# Patient Record
Sex: Male | Born: 1965 | Race: White | Hispanic: No | Marital: Married | Smoking: Current every day smoker
Health system: Southern US, Community
[De-identification: ages and names within clinical notes are randomized; demographics above are authoritative.]

---

## 2001-08-10 ENCOUNTER — Ambulatory Visit (HOSPITAL_COMMUNITY): Admission: RE | Admit: 2001-08-10 | Discharge: 2001-08-10 | Payer: Self-pay | Admitting: General Surgery

## 2003-07-28 ENCOUNTER — Emergency Department (HOSPITAL_COMMUNITY): Admission: EM | Admit: 2003-07-28 | Discharge: 2003-07-28 | Payer: Self-pay | Admitting: Emergency Medicine

## 2004-07-13 ENCOUNTER — Observation Stay (HOSPITAL_COMMUNITY): Admission: EM | Admit: 2004-07-13 | Discharge: 2004-07-15 | Payer: Self-pay | Admitting: *Deleted

## 2005-02-10 IMAGING — CR DG CHEST 2V
4 series · 4 of 4 positions shown · non-contrast
Comparison: none

CLINICAL DATA: Pneumothorax

[view not recorded (1 of 4)]
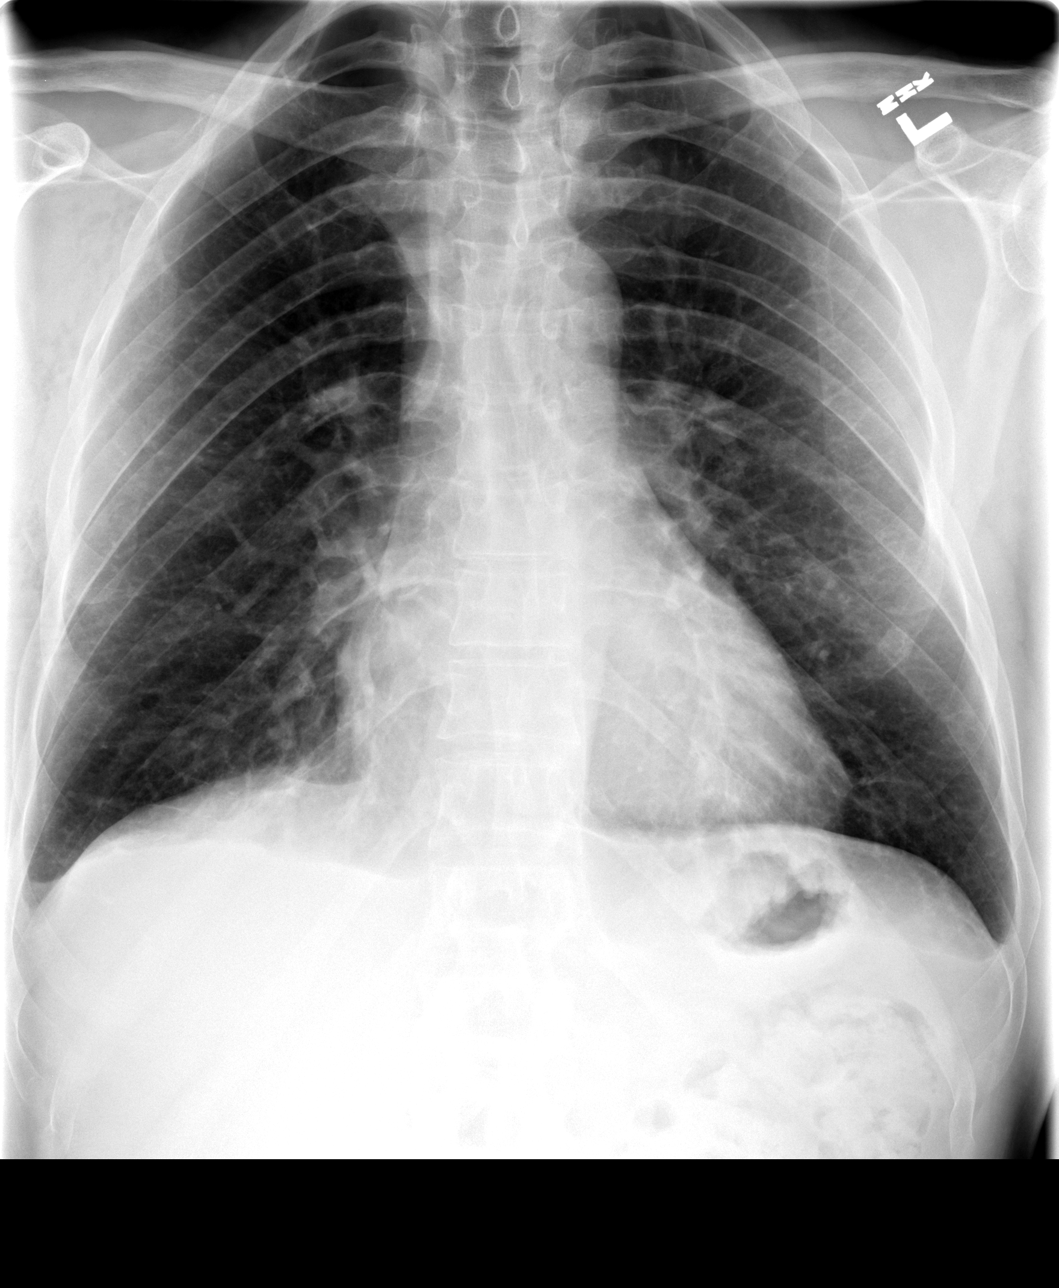

[view not recorded (2 of 4)]
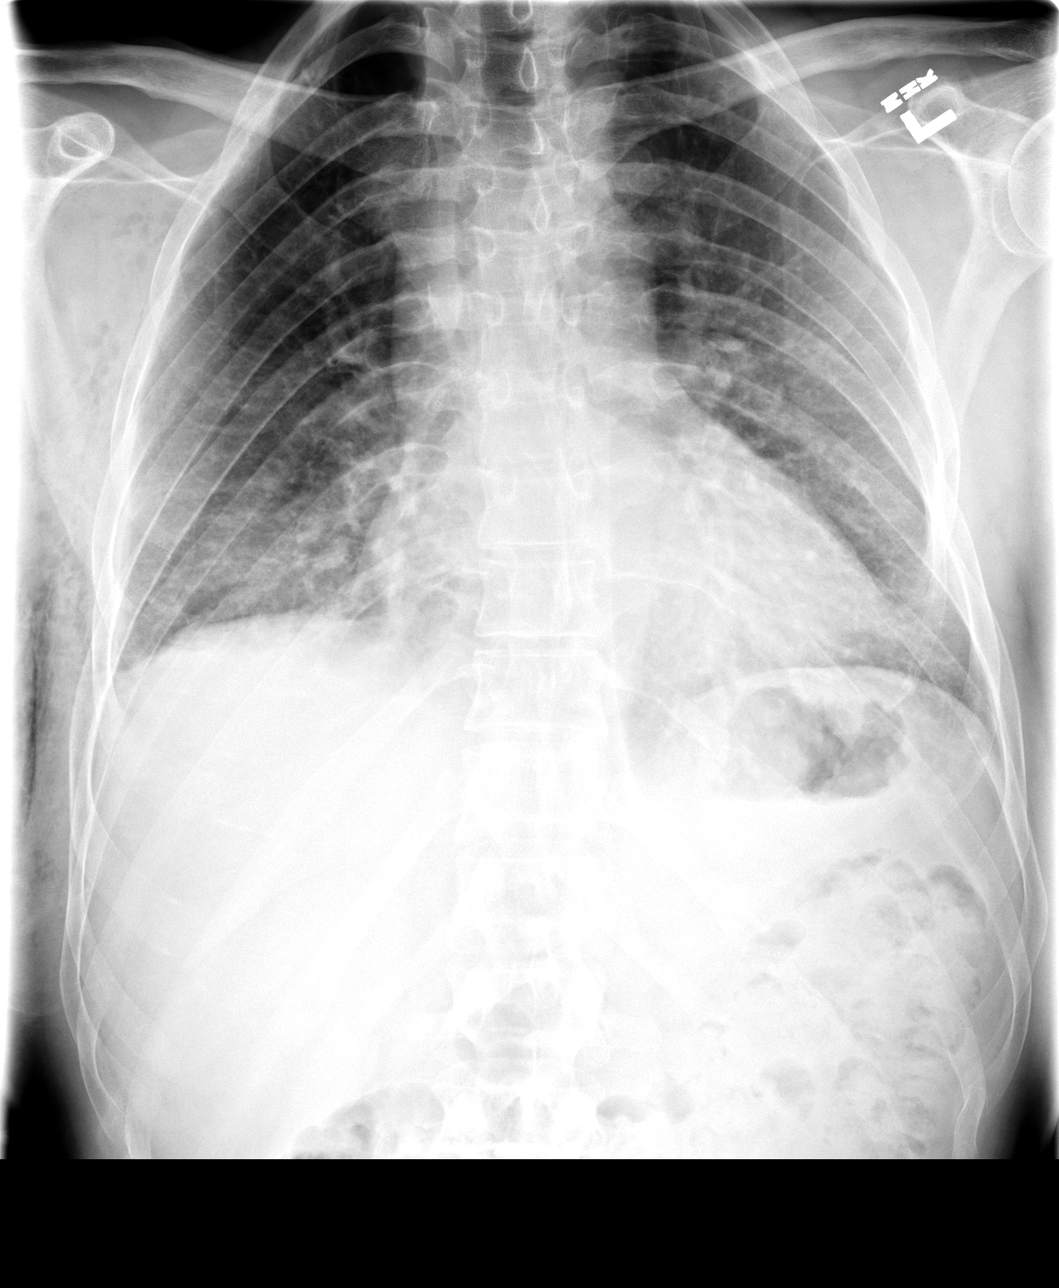

[view not recorded (3 of 4)]
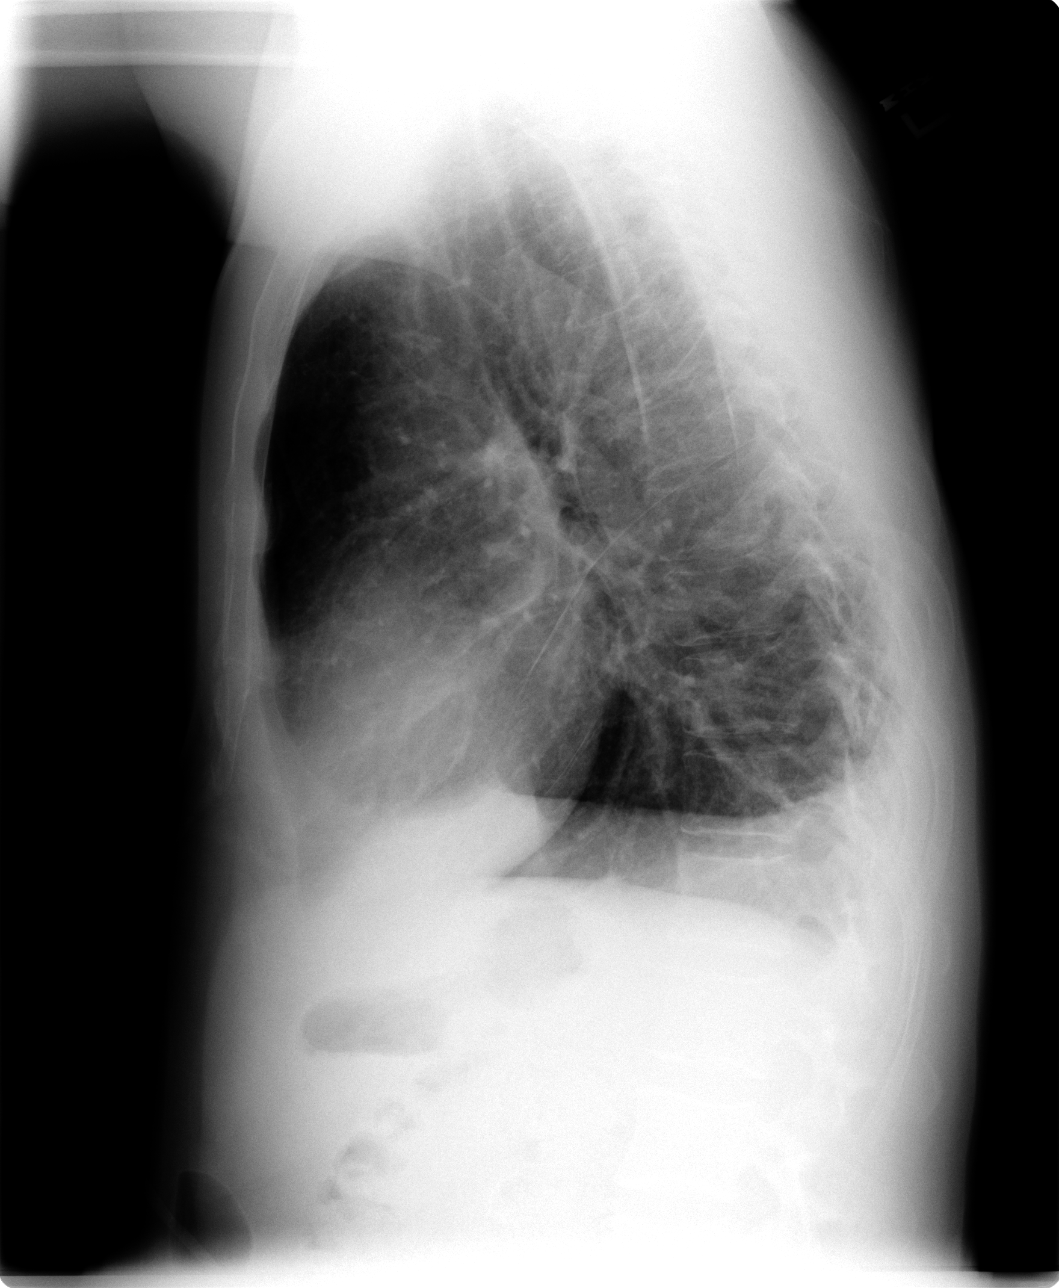

[view not recorded (4 of 4)]
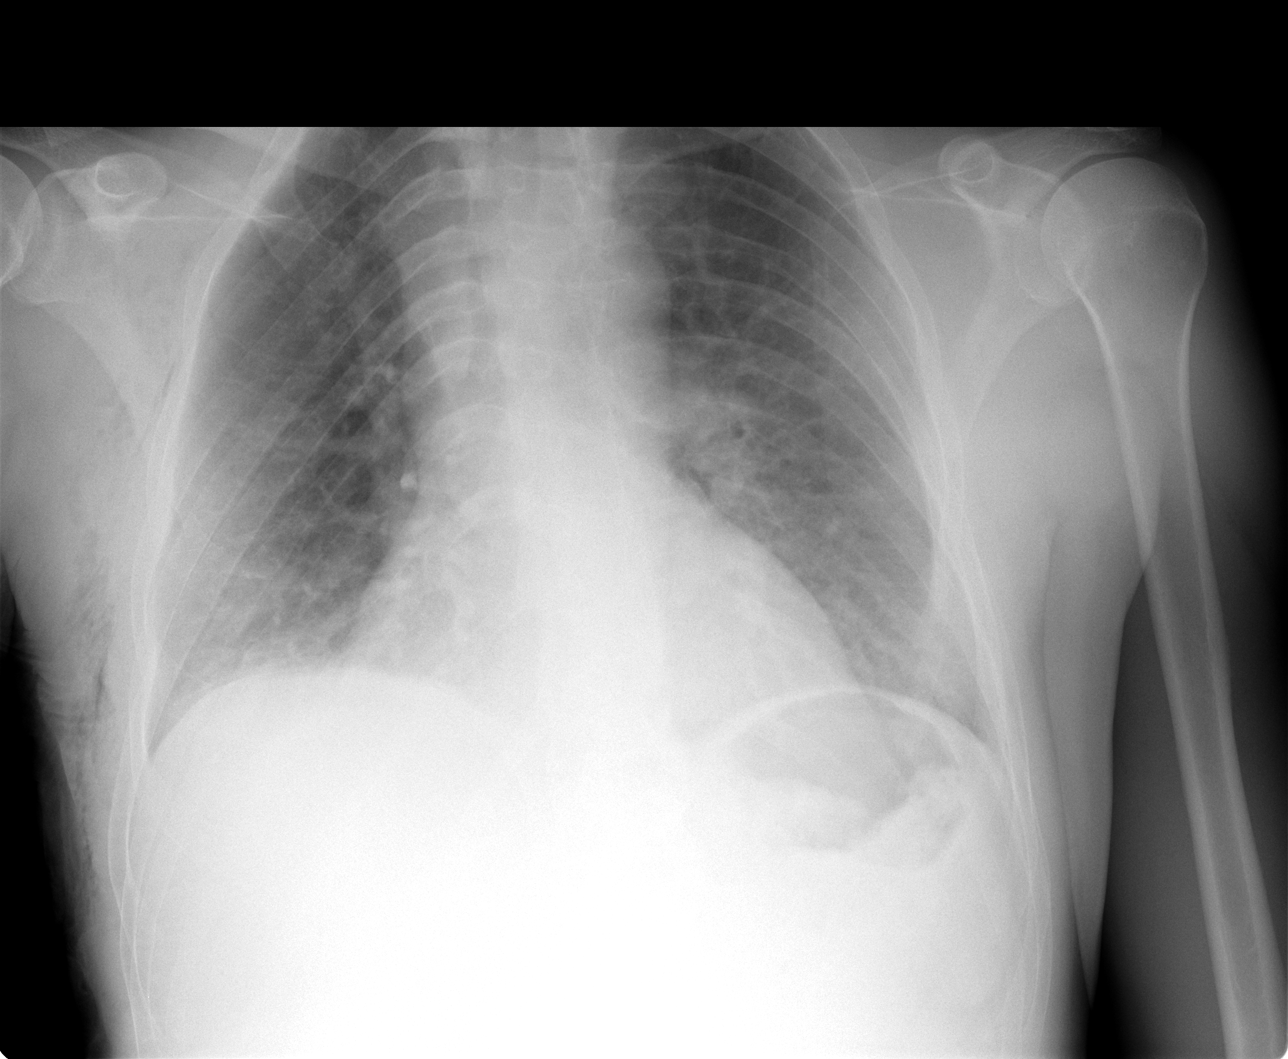

[4 of 4 positions shown; findings below may reference images not displayed]

Chest 2 view:

Comparison 07/14/2004. No definite pneumothorax is seen, with a paucity of
markings of the right lung apex. A small amount of subcutaneous emphysema
persists on the right. Small right pleural effusion. Coarse bronchovascular
markings without confluent infiltrate or overt edema. Heart size upper limits
normal. Visualized bones unremarkable.
IMPRESSION: 1. No evidence of pneumothorax.
2. Small right pleural effusion

## 2018-04-27 ENCOUNTER — Telehealth: Payer: Self-pay | Admitting: Family Medicine

## 2018-04-27 NOTE — Telephone Encounter (Signed)
Mr.Spilker's wife is a current pt, husband was a pt of Dr.Steve's but due to Mr.Haugan not being seen since 03-24-2012 he is no longer listed as a current patient. Mr.Watkin would like to schedule an appt, Mr.Hyndman's wife was made aware if 5 years or more they are considered a new pt and we are no longer accepting new patients. Advise.

## 2018-04-28 NOTE — Telephone Encounter (Signed)
Since already seen in past ok

## 2018-04-28 NOTE — Telephone Encounter (Signed)
Appt scheduled

## 2018-04-30 ENCOUNTER — Ambulatory Visit (INDEPENDENT_AMBULATORY_CARE_PROVIDER_SITE_OTHER): Payer: 59 | Admitting: Family Medicine

## 2018-04-30 ENCOUNTER — Encounter: Payer: Self-pay | Admitting: Family Medicine

## 2018-04-30 VITALS — BP 160/100 | Temp 97.4°F | Ht 69.0 in | Wt 167.0 lb

## 2018-04-30 DIAGNOSIS — H6501 Acute serous otitis media, right ear: Secondary | ICD-10-CM

## 2018-04-30 MED ORDER — AMOXICILLIN-POT CLAVULANATE 875-125 MG PO TABS: ORAL_TABLET | ORAL | 0 refills | Status: AC

## 2018-04-30 NOTE — Progress Notes (Signed)
   Subjective:    Patient ID: Connor Ward, male    DOB: 1965/11/02, 52 y.o.   MRN: [id number]  HPI Patient is here today with complaints of right ear is stopped up and he says it started three weeks ago they also "pop like when you go to the [place]". He has been using peroxide in it to see if that would help.  Patient patient has had some mild congestion but not substantial.  Patient does smoke.  Couple weeks sensation of right ear feeling up at times.  Popping at times.  No major pain.  No fever  Review of Systems No cough no vomiting no diarrhea    Objective:   Physical Exam Alert active good hydration positive right otitis media present left TM normal pharynx normal neck supple.  Lungs clear.  Heart regular rate and rhythm.       Assessment & Plan:  Impression otitis media antibiotics prescribed.  Encouraged to stop smoking.  Regular wellness exams strongly encouraged

## 2019-08-04 ENCOUNTER — Encounter: Payer: Self-pay | Admitting: Family Medicine

## 2020-01-01 ENCOUNTER — Ambulatory Visit
Admission: EM | Admit: 2020-01-01 | Discharge: 2020-01-01 | Disposition: A | Discharge status: Home / Self Care | Payer: 59 | Attending: Emergency Medicine | Admitting: Emergency Medicine

## 2020-01-01 ENCOUNTER — Other Ambulatory Visit: Payer: Self-pay

## 2020-01-01 DIAGNOSIS — M778 Other enthesopathies, not elsewhere classified: Secondary | ICD-10-CM | POA: Diagnosis not present

## 2020-01-01 MED ORDER — SULFAMETHOXAZOLE-TRIMETHOPRIM 800-160 MG PO TABS: 1.0000 | ORAL_TABLET | Freq: Two times a day (BID) | ORAL | 0 refills | Status: AC

## 2020-01-01 MED ORDER — PREDNISONE 10 MG (21) PO TBPK: ORAL_TABLET | ORAL | 0 refills | Status: AC

## 2020-01-01 NOTE — ED Triage Notes (Signed)
Hit right elbow yesterday, is swollen this morning, denies pain

## 2020-01-01 NOTE — ED Provider Notes (Signed)
[facility]   [id number] 01/01/20 Arrival Time: 0807   CC: Elbow pain   SUBJECTIVE: History from: patient.  Connor Ward is a 54 y.o. male who presents to the urgent care for complaint of right temporal swelling and warmth that started this morning.  Reportedly he hit the right ankle yesterday.  He localizes the pain to the right elbow.  He describes the pain as constant and achy.  He has tried OTC medications without relief.  His symptoms are made worse with ROM.  He denies similar symptoms in the past.  Denies chills, fever, nausea, vomiting, diarrhea.   ROS: As per HPI.  All other pertinent ROS negative.     History reviewed. No pertinent past medical history. History reviewed. No pertinent surgical history. No Known Allergies No current facility-administered medications on file prior to encounter.   Current Outpatient Medications on File Prior to Encounter  Medication Sig Dispense Refill   amoxicillin-clavulanate (AUGMENTIN) 875-125 MG tablet One p o bid for ten d 20 tablet 0   Social History   Socioeconomic History   Marital status: Married    Spouse name: Not on file   Number of children: Not on file   Years of education: Not on file   Highest education level: Not on file  Occupational History   Not on file  Tobacco Use   Smoking status: Current Every Day Smoker    Packs/day: 1.00    Years: 30.00    Pack years: 30.00    Types: Cigarettes   Smokeless tobacco: Never Used  Substance and Sexual Activity   Alcohol use: Never   Drug use: Never   Sexual activity: Not on file  Other Topics Concern   Not on file  Social History Narrative   Not on file   Social Determinants of Health   Financial Resource Strain:    Difficulty of Paying Living Expenses:   Food Insecurity:    Worried About Programme researcher, broadcasting/film/video in the Last Year:    Barista in the Last Year:   Transportation Needs:    Freight forwarder (Medical):    Lack  of Transportation (Non-Medical):   Physical Activity:    Days of Exercise per Week:    Minutes of Exercise per Session:   Stress:    Feeling of Stress :   Social Connections:    Frequency of Communication with Friends and Family:    Frequency of Social Gatherings with Friends and Family:    Attends Religious Services:    Active Member of Clubs or Organizations:    Attends Engineer, structural:    Marital Status:   Intimate Partner Violence:    Fear of Current or Ex-Partner:    Emotionally Abused:    Physically Abused:    Sexually Abused:    Family History  Family history unknown: Yes    OBJECTIVE:  Vitals:   01/01/20 0815  BP: (!) 159/96  Pulse: 64  Resp: 16  Temp: 97.9 F (36.6 C)  TempSrc: Oral  SpO2: 97%     Physical Exam Vitals and nursing note reviewed.  Constitutional:      General: He is not in acute distress.    Appearance: Normal appearance. He is normal weight. He is not ill-appearing, toxic-appearing or diaphoretic.  Cardiovascular:     Rate and Rhythm: Normal rate and regular rhythm.     Pulses: Normal pulses.     Heart sounds: Normal heart sounds.  No murmur heard.  No friction rub. No gallop.   Pulmonary:     Effort: Pulmonary effort is normal. No respiratory distress.     Breath sounds: Normal breath sounds. No stridor. No wheezing, rhonchi or rales.  Chest:     Chest wall: No tenderness.  Musculoskeletal:        General: Swelling and tenderness present.     Right elbow: Swelling and deformity present. No lacerations. Tenderness present.     Left elbow: Normal.     Comments: The right elbow is with obvious deformity when compared to the left elbow.  Warmth present.  There is no surface trauma, ecchymosis, open wound.  Normal range of motion with mild pain.  Neurovascular status intact.  Neurological:     Mental Status: He is alert.      LABS:  No results found for this or any previous visit (from the past 24 hour(s)).    ASSESSMENT & PLAN:  1. Right elbow tendinitis     Meds ordered this encounter  Medications   predniSONE (STERAPRED UNI-PAK 21 TAB) 10 MG (21) TBPK tablet    Sig: Take 6 tabs by mouth daily  for 1 days, then 5 tabs for 1 days, then 4 tabs for 1 days, then 3 tabs for 1 days, 2 tabs for 1 days, then 1 tab by mouth daily for 1 days    Dispense:  21 tablet    Refill:  0   sulfamethoxazole-trimethoprim (BACTRIM DS) 800-160 MG tablet    Sig: Take 1 tablet by mouth 2 (two) times daily for 7 days.    Dispense:  14 tablet    Refill:  0    Patient is stable at discharge.  Prednisone was prescribed for both tendinitis.  Bactrim DS was prescribed as patient has developed abscess in the past with tendinitis.  Discharge Instructions  Prednisone was prescribed/take as directed Bactrim DS was prescribed/take as directed Follow-up with PCP Follow RICE instruction that is attached Return or go to ED for worsening of symptoms  Reviewed expectations re: course of current medical issues. Questions answered. Outlined signs and symptoms indicating need for more acute intervention. Patient verbalized understanding. After Visit Summary given.      Note: This document was prepared using Dragon voice recognition software and may include unintentional dictation errors.    Durward Parcel, FNP 01/01/20 [phone]

## 2020-01-01 NOTE — Discharge Instructions (Addendum)
Prednisone was prescribed/take as directed Bactrim DS was prescribed/take as directed Follow-up with PCP Follow RICE instruction that is attached Return or go to ED for worsening of symptoms
